# Patient Record
Sex: Female | Born: 1970 | Race: Black or African American | Hispanic: No | Marital: Married | Smoking: Never smoker
Health system: Southern US, Community
[De-identification: ages and names within clinical notes are randomized; demographics above are authoritative.]

---

## 2009-12-24 ENCOUNTER — Other Ambulatory Visit: Admission: RE | Admit: 2009-12-24 | Discharge: 2009-12-24 | Payer: Self-pay | Admitting: Family Medicine

## 2011-01-02 ENCOUNTER — Other Ambulatory Visit
Admission: RE | Admit: 2011-01-02 | Discharge: 2011-01-02 | Payer: Self-pay | Source: Home / Self Care | Admitting: Family Medicine

## 2011-01-13 ENCOUNTER — Encounter
Admission: RE | Admit: 2011-01-13 | Discharge: 2011-01-13 | Payer: Self-pay | Source: Home / Self Care | Attending: Family Medicine | Admitting: Family Medicine

## 2012-01-08 ENCOUNTER — Ambulatory Visit (HOSPITAL_COMMUNITY)
Admission: RE | Admit: 2012-01-08 | Payer: BC Managed Care – PPO | Source: Ambulatory Visit | Admitting: Obstetrics and Gynecology

## 2012-03-07 ENCOUNTER — Ambulatory Visit (INDEPENDENT_AMBULATORY_CARE_PROVIDER_SITE_OTHER): Payer: BC Managed Care – PPO | Admitting: Family Medicine

## 2012-03-07 VITALS — BP 131/80 | HR 61 | Temp 98.4°F | Resp 16 | Ht 67.0 in | Wt 160.0 lb

## 2012-03-07 DIAGNOSIS — J019 Acute sinusitis, unspecified: Secondary | ICD-10-CM

## 2012-03-07 MED ORDER — AZITHROMYCIN 250 MG PO TABS: ORAL_TABLET | ORAL | Status: AC

## 2012-03-07 NOTE — Progress Notes (Signed)
  Subjective:    Patient ID: Maria Roach, female    DOB: 10-Dec-1971, 41 y.o.   MRN: [id number]  HPI 41 yo female with URI complaints for 3 weeks.  Started out clear drainage, like allergies.  Progressed to thicker discharge, went into chest, daughter got same symptoms.  Phlegm turned yellow.  Trying multiple OTC meds without improvement.  Had some diarrhea yesterday.  Now turning hoarse.  Coughing, some difficulty falling asleep due to cough.  No fevers.  Slight sore throat.  Ears full.     Review of Systems Negative except as per HPI     Objective:   Physical Exam  Constitutional: She appears well-developed. No distress.  HENT:  Right Ear: Tympanic membrane, external ear and ear canal normal. Tympanic membrane is not injected, not scarred, not perforated, not erythematous, not retracted and not bulging.  Left Ear: Tympanic membrane, external ear and ear canal normal. Tympanic membrane is not injected, not scarred, not perforated, not erythematous, not retracted and not bulging.  Nose: No mucosal edema or rhinorrhea. Right sinus exhibits no maxillary sinus tenderness and no frontal sinus tenderness. Left sinus exhibits no maxillary sinus tenderness and no frontal sinus tenderness.  Mouth/Throat: Uvula is midline, oropharynx is clear and moist and mucous membranes are normal. No oropharyngeal exudate or tonsillar abscesses.  Cardiovascular: Normal rate, regular rhythm, normal heart sounds and intact distal pulses.   No murmur heard. Pulmonary/Chest: Effort normal and breath sounds normal. No respiratory distress. She has no wheezes. She has no rales.  Lymphadenopathy:       Head (right side): No submandibular and no preauricular adenopathy present.       Head (left side): No submandibular and no preauricular adenopathy present.       Right cervical: No superficial cervical and no posterior cervical adenopathy present.      Left cervical: No superficial cervical and no posterior cervical  adenopathy present.       Right: No supraclavicular adenopathy present.       Left: No supraclavicular adenopathy present.  Skin: Skin is warm and dry.          Assessment & Plan:  URI/sinusitis - zpak.  Declines cough medicine at this time.

## 2013-01-17 ENCOUNTER — Other Ambulatory Visit (HOSPITAL_COMMUNITY)
Admission: RE | Admit: 2013-01-17 | Discharge: 2013-01-17 | Disposition: A | Discharge status: Home / Self Care | Payer: BC Managed Care – PPO | Source: Ambulatory Visit | Attending: Obstetrics and Gynecology | Admitting: Obstetrics and Gynecology

## 2013-01-17 ENCOUNTER — Other Ambulatory Visit: Payer: Self-pay | Admitting: Obstetrics and Gynecology

## 2013-01-17 DIAGNOSIS — Z01419 Encounter for gynecological examination (general) (routine) without abnormal findings: Secondary | ICD-10-CM | POA: Insufficient documentation

## 2013-01-17 DIAGNOSIS — Z1151 Encounter for screening for human papillomavirus (HPV): Secondary | ICD-10-CM | POA: Insufficient documentation

## 2013-01-17 DIAGNOSIS — R87810 Cervical high risk human papillomavirus (HPV) DNA test positive: Secondary | ICD-10-CM | POA: Insufficient documentation

## 2013-03-05 ENCOUNTER — Other Ambulatory Visit: Payer: Self-pay | Admitting: Obstetrics and Gynecology

## 2013-03-06 ENCOUNTER — Other Ambulatory Visit: Payer: Self-pay

## 2013-03-06 DIAGNOSIS — Z1231 Encounter for screening mammogram for malignant neoplasm of breast: Secondary | ICD-10-CM

## 2013-04-02 ENCOUNTER — Ambulatory Visit: Payer: BC Managed Care – PPO

## 2013-05-02 ENCOUNTER — Ambulatory Visit: Payer: BC Managed Care – PPO

## 2013-05-15 ENCOUNTER — Ambulatory Visit
Admission: RE | Admit: 2013-05-15 | Discharge: 2013-05-15 | Disposition: A | Discharge status: Home / Self Care | Payer: Medicaid Other | Source: Ambulatory Visit

## 2013-05-15 DIAGNOSIS — Z1231 Encounter for screening mammogram for malignant neoplasm of breast: Secondary | ICD-10-CM

## 2018-06-03 ENCOUNTER — Other Ambulatory Visit: Payer: Self-pay | Admitting: Obstetrics and Gynecology

## 2018-06-03 ENCOUNTER — Other Ambulatory Visit (HOSPITAL_COMMUNITY)
Admission: RE | Admit: 2018-06-03 | Discharge: 2018-06-03 | Disposition: A | Discharge status: Home / Self Care | Payer: BLUE CROSS/BLUE SHIELD | Source: Ambulatory Visit | Attending: Obstetrics and Gynecology | Admitting: Obstetrics and Gynecology

## 2018-06-03 DIAGNOSIS — Z01411 Encounter for gynecological examination (general) (routine) with abnormal findings: Secondary | ICD-10-CM | POA: Insufficient documentation

## 2018-06-03 DIAGNOSIS — Z1231 Encounter for screening mammogram for malignant neoplasm of breast: Secondary | ICD-10-CM

## 2018-06-05 LAB — CYTOLOGY - PAP
DIAGNOSIS: NEGATIVE
HPV: NOT DETECTED

## 2018-06-21 ENCOUNTER — Ambulatory Visit
Admission: RE | Admit: 2018-06-21 | Discharge: 2018-06-21 | Disposition: A | Discharge status: Home / Self Care | Payer: BLUE CROSS/BLUE SHIELD | Source: Ambulatory Visit | Attending: Obstetrics and Gynecology | Admitting: Obstetrics and Gynecology

## 2018-06-21 ENCOUNTER — Ambulatory Visit: Payer: BLUE CROSS/BLUE SHIELD

## 2018-06-21 DIAGNOSIS — Z1231 Encounter for screening mammogram for malignant neoplasm of breast: Secondary | ICD-10-CM

## 2019-06-18 ENCOUNTER — Other Ambulatory Visit: Payer: Self-pay | Admitting: Obstetrics and Gynecology

## 2019-06-18 DIAGNOSIS — Z1231 Encounter for screening mammogram for malignant neoplasm of breast: Secondary | ICD-10-CM

## 2019-06-27 ENCOUNTER — Ambulatory Visit
Admission: RE | Admit: 2019-06-27 | Discharge: 2019-06-27 | Disposition: A | Discharge status: Home / Self Care | Payer: BC Managed Care – PPO | Source: Ambulatory Visit | Attending: Obstetrics and Gynecology | Admitting: Obstetrics and Gynecology

## 2019-06-27 DIAGNOSIS — Z1231 Encounter for screening mammogram for malignant neoplasm of breast: Secondary | ICD-10-CM

## 2019-07-11 ENCOUNTER — Ambulatory Visit: Payer: BLUE CROSS/BLUE SHIELD

## 2020-02-19 ENCOUNTER — Ambulatory Visit: Payer: BC Managed Care – PPO | Attending: Internal Medicine

## 2020-02-19 DIAGNOSIS — Z23 Encounter for immunization: Secondary | ICD-10-CM

## 2020-02-19 NOTE — Progress Notes (Signed)
   Covid-19 Vaccination Clinic  Name:  Maria Roach    MRN: [id number] DOB: May 12, 1971  02/19/2020  [name] was observed post Covid-19 immunization for 15 minutes without incident. She was provided with Vaccine Information Sheet and instruction to access the V-Safe system.   [name] was instructed to call 911 with any severe reactions post vaccine: Marland Kitchen Difficulty breathing  . Swelling of face and throat  . A fast heartbeat  . A bad rash all over body  . Dizziness and weakness   Immunizations Administered    Name Date Dose VIS Date Route   Pfizer COVID-19 Vaccine 02/19/2020  4:35 PM 0.3 mL 11/28/2019 Intramuscular   Manufacturer: [employer], [employer]   Lot: [id number]   NDC: [address]-8

## 2020-03-13 ENCOUNTER — Ambulatory Visit: Payer: BC Managed Care – PPO | Attending: Internal Medicine

## 2020-03-13 DIAGNOSIS — Z23 Encounter for immunization: Secondary | ICD-10-CM

## 2020-03-13 NOTE — Progress Notes (Signed)
   Covid-19 Vaccination Clinic  Name:  Maria Roach    MRN: [id number] DOB: May 05, 1971  03/13/2020  [name] was observed post Covid-19 immunization for 15 minutes without incident. She was provided with Vaccine Information Sheet and instruction to access the V-Safe system.   [name] was instructed to call 911 with any severe reactions post vaccine: Marland Kitchen Difficulty breathing  . Swelling of face and throat  . A fast heartbeat  . A bad rash all over body  . Dizziness and weakness   Immunizations Administered    Name Date Dose VIS Date Route   Pfizer COVID-19 Vaccine 03/13/2020  9:23 AM 0.3 mL 11/28/2019 Intramuscular   Manufacturer: [employer], [employer]   Lot: [id number]   NDC: [address]-4

## 2020-06-22 ENCOUNTER — Other Ambulatory Visit: Payer: Self-pay | Admitting: Obstetrics and Gynecology

## 2020-06-22 DIAGNOSIS — Z1231 Encounter for screening mammogram for malignant neoplasm of breast: Secondary | ICD-10-CM

## 2020-06-29 ENCOUNTER — Ambulatory Visit
Admission: RE | Admit: 2020-06-29 | Discharge: 2020-06-29 | Disposition: A | Discharge status: Home / Self Care | Payer: BC Managed Care – PPO | Source: Ambulatory Visit | Attending: Obstetrics and Gynecology | Admitting: Obstetrics and Gynecology

## 2020-06-29 ENCOUNTER — Other Ambulatory Visit: Payer: Self-pay

## 2020-06-29 DIAGNOSIS — Z1231 Encounter for screening mammogram for malignant neoplasm of breast: Secondary | ICD-10-CM

## 2021-06-06 ENCOUNTER — Other Ambulatory Visit: Payer: Self-pay | Admitting: Obstetrics and Gynecology

## 2021-06-06 DIAGNOSIS — Z1231 Encounter for screening mammogram for malignant neoplasm of breast: Secondary | ICD-10-CM

## 2021-07-29 ENCOUNTER — Ambulatory Visit
Admission: RE | Admit: 2021-07-29 | Discharge: 2021-07-29 | Disposition: A | Discharge status: Home / Self Care | Payer: BC Managed Care – PPO | Source: Ambulatory Visit | Attending: Obstetrics and Gynecology | Admitting: Obstetrics and Gynecology

## 2021-07-29 ENCOUNTER — Other Ambulatory Visit: Payer: Self-pay

## 2021-07-29 DIAGNOSIS — Z1231 Encounter for screening mammogram for malignant neoplasm of breast: Secondary | ICD-10-CM

## 2022-07-03 ENCOUNTER — Other Ambulatory Visit: Payer: Self-pay | Admitting: Obstetrics and Gynecology

## 2022-07-03 DIAGNOSIS — Z1231 Encounter for screening mammogram for malignant neoplasm of breast: Secondary | ICD-10-CM

## 2022-08-01 ENCOUNTER — Ambulatory Visit
Admission: RE | Admit: 2022-08-01 | Discharge: 2022-08-01 | Disposition: A | Discharge status: Home / Self Care | Payer: BC Managed Care – PPO | Source: Ambulatory Visit | Attending: Obstetrics and Gynecology | Admitting: Obstetrics and Gynecology

## 2022-08-01 DIAGNOSIS — Z1231 Encounter for screening mammogram for malignant neoplasm of breast: Secondary | ICD-10-CM

## 2023-03-10 IMAGING — MG MM DIGITAL SCREENING BILAT W/ TOMO AND CAD
8 series · 9 of 24 positions shown · non-contrast
Comparison: Previous exam(s).

CLINICAL DATA: Screening.

EXAM:
DIGITAL SCREENING BILATERAL MAMMOGRAM WITH TOMOSYNTHESIS AND CAD
TECHNIQUE: Bilateral screening digital craniocaudal and mediolateral oblique
mammograms were obtained. Bilateral screening digital breast
tomosynthesis was performed. The images were evaluated with
computer-aided detection.

[L CC synth-2D]
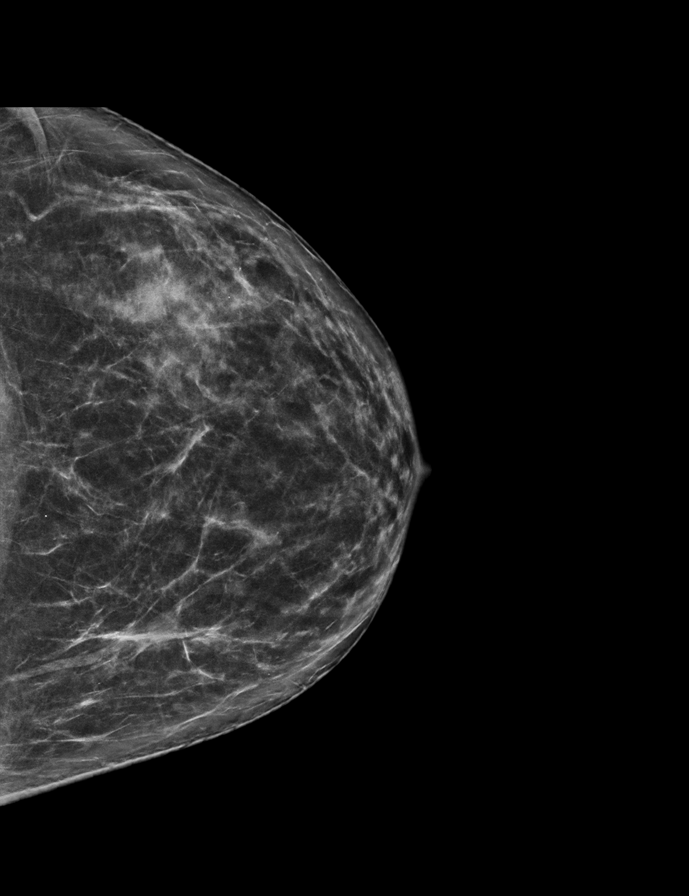

[L MLO synth-2D]
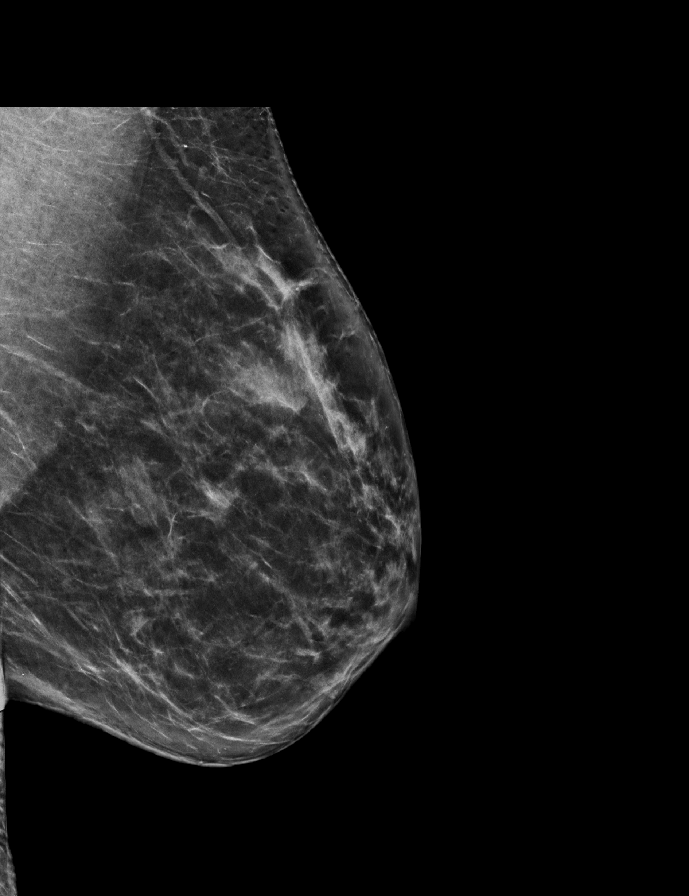

[R MLO synth-2D]
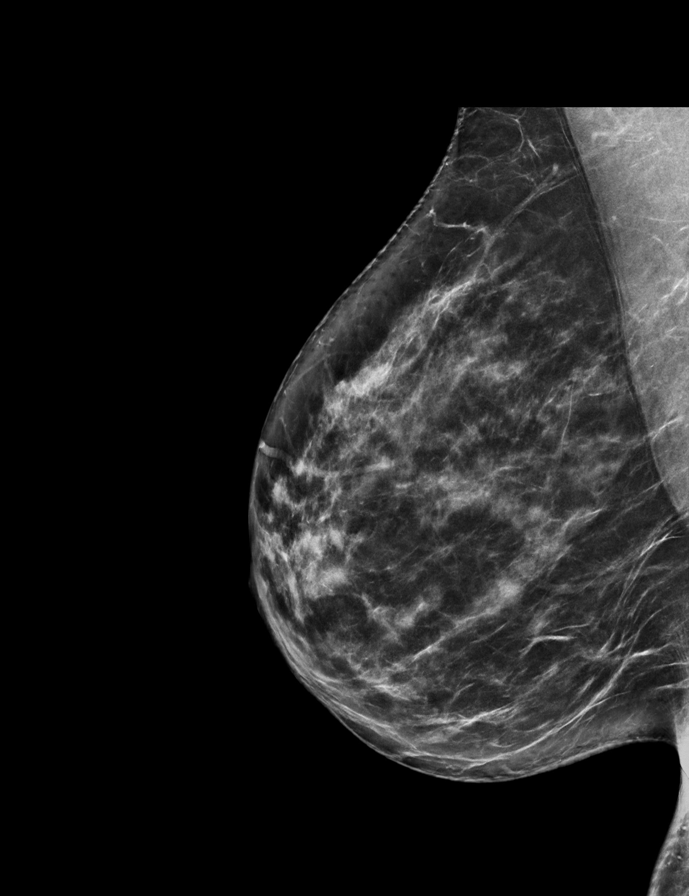

[R CC synth-2D]
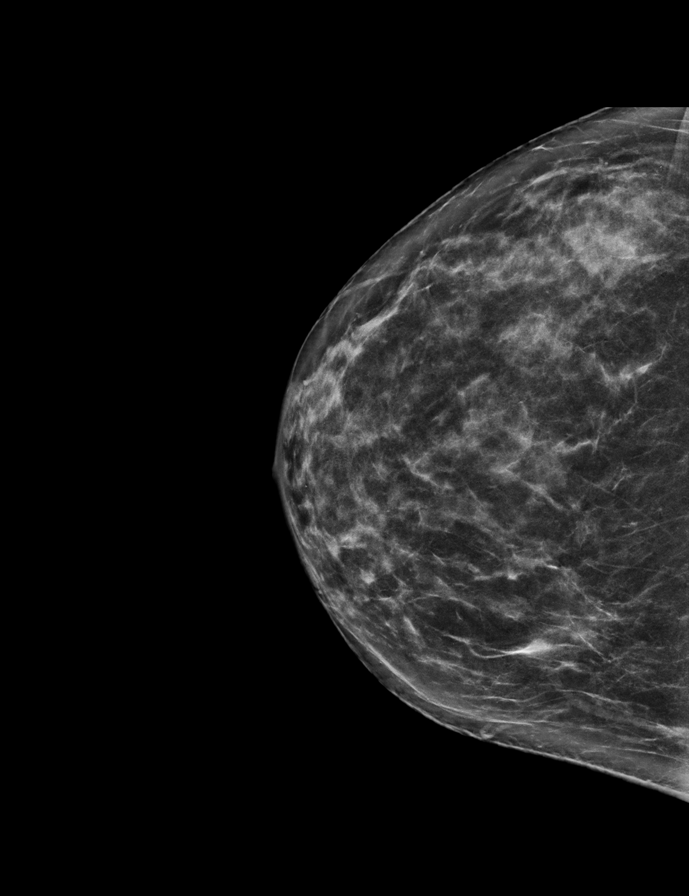

[R MLO tomo · 2 of 75 frames shown]
[frame 25/75]
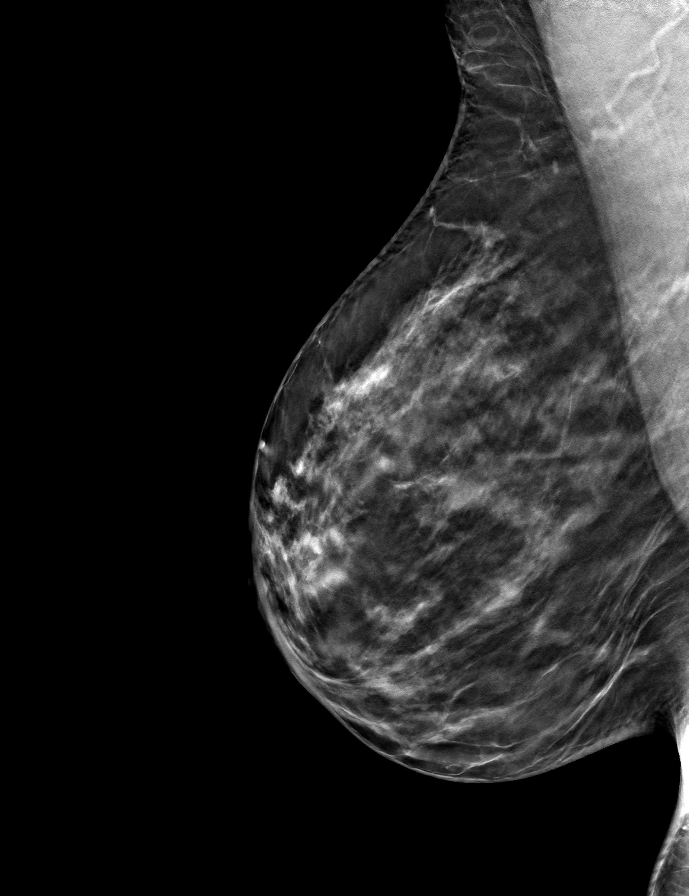
[frame 38/75]
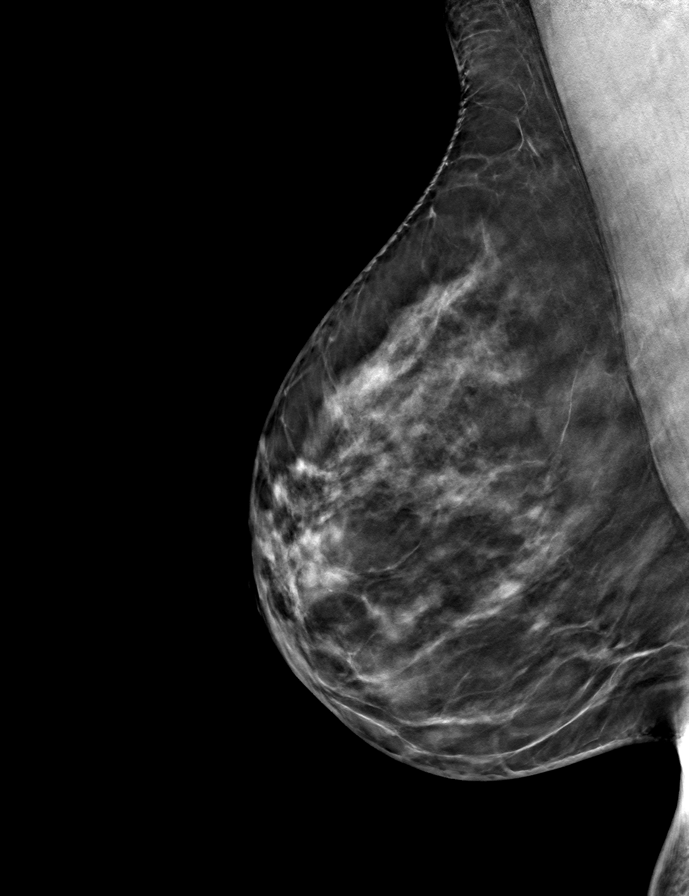

[L MLO tomo · tomo slice 37/72.0]
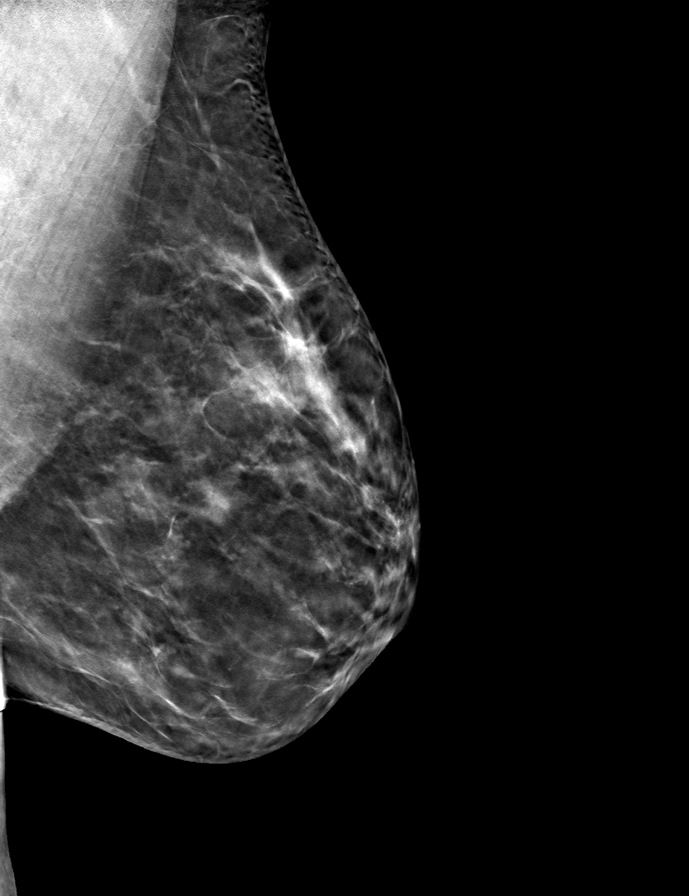

[L CC tomo · tomo slice 33/65.0]
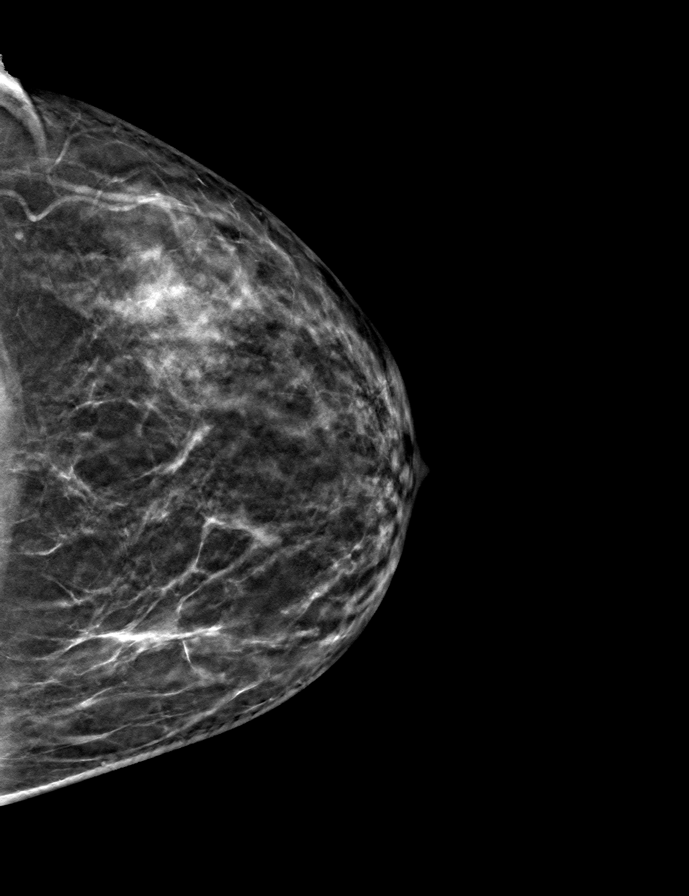

[R CC tomo · tomo slice 33/66.0]
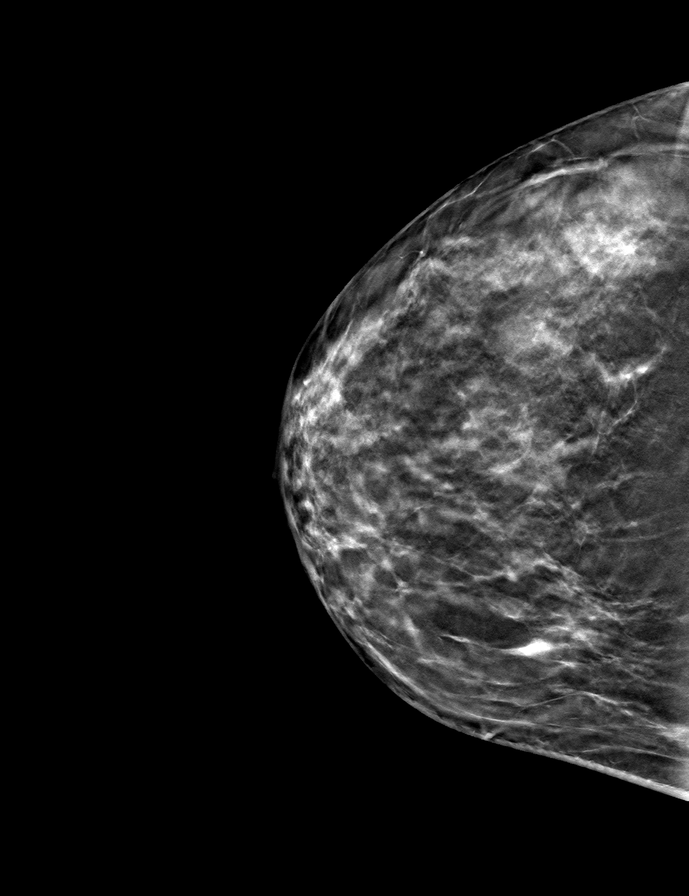

[9 of 24 positions shown; findings below may reference images not displayed]

ACR Breast Density Category c: The breast tissue is heterogeneously
dense, which may obscure small masses.
FINDINGS: There are no findings suspicious for malignancy.
IMPRESSION: No mammographic evidence of malignancy. A result letter of this
screening mammogram will be mailed directly to the patient.

RECOMMENDATION:
Screening mammogram in one year. (Code:Q3-W-BC3)

BI-RADS CATEGORY  1: Negative.

## 2023-07-06 ENCOUNTER — Other Ambulatory Visit: Payer: Self-pay | Admitting: Obstetrics and Gynecology

## 2023-07-06 DIAGNOSIS — Z1231 Encounter for screening mammogram for malignant neoplasm of breast: Secondary | ICD-10-CM

## 2023-08-06 ENCOUNTER — Ambulatory Visit: Payer: BC Managed Care – PPO

## 2023-08-17 ENCOUNTER — Ambulatory Visit: Payer: BC Managed Care – PPO

## 2023-08-21 ENCOUNTER — Ambulatory Visit: Payer: BC Managed Care – PPO

## 2023-08-21 DIAGNOSIS — Z1231 Encounter for screening mammogram for malignant neoplasm of breast: Secondary | ICD-10-CM

## 2024-07-16 ENCOUNTER — Other Ambulatory Visit: Payer: Self-pay | Admitting: Obstetrics and Gynecology

## 2024-07-16 DIAGNOSIS — Z1231 Encounter for screening mammogram for malignant neoplasm of breast: Secondary | ICD-10-CM

## 2024-08-19 ENCOUNTER — Other Ambulatory Visit (HOSPITAL_COMMUNITY)
Admission: RE | Admit: 2024-08-19 | Discharge: 2024-08-19 | Disposition: A | Discharge status: Home / Self Care | Source: Ambulatory Visit | Attending: Obstetrics and Gynecology | Admitting: Obstetrics and Gynecology

## 2024-08-19 ENCOUNTER — Other Ambulatory Visit: Payer: Self-pay | Admitting: Obstetrics and Gynecology

## 2024-08-19 DIAGNOSIS — Z01419 Encounter for gynecological examination (general) (routine) without abnormal findings: Secondary | ICD-10-CM | POA: Diagnosis present

## 2024-08-21 ENCOUNTER — Ambulatory Visit
Admission: RE | Admit: 2024-08-21 | Discharge: 2024-08-21 | Disposition: A | Discharge status: Home / Self Care | Payer: Self-pay | Source: Ambulatory Visit | Attending: Obstetrics and Gynecology | Admitting: Obstetrics and Gynecology

## 2024-08-21 DIAGNOSIS — Z1231 Encounter for screening mammogram for malignant neoplasm of breast: Secondary | ICD-10-CM

## 2024-08-25 LAB — CYTOLOGY - PAP
Comment: NEGATIVE
Diagnosis: NEGATIVE
Diagnosis: REACTIVE
High risk HPV: NEGATIVE
